# Patient Record
Sex: Male | Born: 1935 | Race: Black or African American | Hispanic: No | Marital: Married | Smoking: Former smoker
Health system: Southern US, Community
[De-identification: ages and names within clinical notes are randomized; demographics above are authoritative.]

## PROBLEM LIST (undated history)

## (undated) DIAGNOSIS — E119 Type 2 diabetes mellitus without complications: Secondary | ICD-10-CM

---

## 2012-05-27 ENCOUNTER — Emergency Department: Payer: Self-pay | Admitting: Emergency Medicine

## 2016-01-28 ENCOUNTER — Emergency Department
Admission: EM | Admit: 2016-01-28 | Discharge: 2016-01-28 | Payer: Medicare Other | Attending: Emergency Medicine | Admitting: Emergency Medicine

## 2016-01-28 ENCOUNTER — Emergency Department: Payer: Medicare Other

## 2016-01-28 ENCOUNTER — Encounter: Payer: Self-pay | Admitting: Emergency Medicine

## 2016-01-28 DIAGNOSIS — Y9389 Activity, other specified: Secondary | ICD-10-CM | POA: Insufficient documentation

## 2016-01-28 DIAGNOSIS — E119 Type 2 diabetes mellitus without complications: Secondary | ICD-10-CM | POA: Insufficient documentation

## 2016-01-28 DIAGNOSIS — S72141A Displaced intertrochanteric fracture of right femur, initial encounter for closed fracture: Secondary | ICD-10-CM | POA: Insufficient documentation

## 2016-01-28 DIAGNOSIS — W1839XA Other fall on same level, initial encounter: Secondary | ICD-10-CM | POA: Insufficient documentation

## 2016-01-28 DIAGNOSIS — Y92810 Car as the place of occurrence of the external cause: Secondary | ICD-10-CM | POA: Insufficient documentation

## 2016-01-28 DIAGNOSIS — S72001A Fracture of unspecified part of neck of right femur, initial encounter for closed fracture: Secondary | ICD-10-CM

## 2016-01-28 DIAGNOSIS — Z87891 Personal history of nicotine dependence: Secondary | ICD-10-CM | POA: Insufficient documentation

## 2016-01-28 DIAGNOSIS — Y990 Civilian activity done for income or pay: Secondary | ICD-10-CM | POA: Insufficient documentation

## 2016-01-28 HISTORY — DX: Type 2 diabetes mellitus without complications: E11.9

## 2016-01-28 LAB — COMPREHENSIVE METABOLIC PANEL
ALBUMIN: 4.2 g/dL (ref 3.5–5.0)
ALK PHOS: 46 U/L (ref 38–126)
ALT: 8 U/L — AB (ref 17–63)
AST: 14 U/L — AB (ref 15–41)
Anion gap: 6 (ref 5–15)
BILIRUBIN TOTAL: 0.9 mg/dL (ref 0.3–1.2)
BUN: 16 mg/dL (ref 6–20)
CO2: 26 mmol/L (ref 22–32)
CREATININE: 1.01 mg/dL (ref 0.61–1.24)
Calcium: 9.9 mg/dL (ref 8.9–10.3)
Chloride: 104 mmol/L (ref 101–111)
GFR calc Af Amer: >60 mL/min (ref >60)
GLUCOSE: 147 mg/dL — AB (ref 65–99)
Potassium: 4.1 mmol/L (ref 3.5–5.1)
Sodium: 136 mmol/L (ref 135–145)
TOTAL PROTEIN: 7.3 g/dL (ref 6.5–8.1)

## 2016-01-28 LAB — CBC WITH DIFFERENTIAL/PLATELET
BASOS ABS: 0 10*3/uL (ref 0–0.1)
Basophils Relative: 0 %
Eosinophils Absolute: 0.2 10*3/uL (ref 0–0.7)
Eosinophils Relative: 2 %
HEMATOCRIT: 37.9 % — AB (ref 40.0–52.0)
HEMOGLOBIN: 12.7 g/dL — AB (ref 13.0–18.0)
LYMPHS PCT: 13 %
Lymphs Abs: 1.2 10*3/uL (ref 1.0–3.6)
MCH: 30.3 pg (ref 26.0–34.0)
MCHC: 33.6 g/dL (ref 32.0–36.0)
MCV: 90.2 fL (ref 80.0–100.0)
MONO ABS: 0.4 10*3/uL (ref 0.2–1.0)
Monocytes Relative: 5 %
NEUTROS ABS: 7.1 10*3/uL — AB (ref 1.4–6.5)
NEUTROS PCT: 80 %
Platelets: 163 10*3/uL (ref 150–440)
RBC: 4.2 MIL/uL — AB (ref 4.40–5.90)
RDW: 14 % (ref 11.5–14.5)
WBC: 8.9 10*3/uL (ref 3.8–10.6)

## 2016-01-28 NOTE — ED Provider Notes (Signed)
[facility] Emergency Department Provider Note  Time seen: 4:00 PM  I have reviewed the triage vital signs and the nursing notes.   HISTORY  Chief Complaint Fall    HPI Jesse Decker is a 82 y.o. male with a past medical history of diabetes who presents the emergency department after mechanical fall. According to The Patient Was outside Trying to Get Something Out Of the Car When [facility] onto His Right Side. Denies Hitting His Head. Denies LOC. Denies Vomiting. His Only Complaint Is Right Hip Pain. Worse with Any Attempted Movement.  Patient describes the right hip pain as moderate, severe when moving. Dull aching type pain, sharp with movement. Normal sensation to the leg.  Past Medical History:  Diagnosis Date  . Diabetes mellitus without complication (HCC)     There are no active problems to display for this patient.   No past surgical history on file.  Prior to Admission medications   Not on File    No Known Allergies  No family history on file.  Social History Social History  Substance Use Topics  . Smoking status: Former Smoker    Types: Cigarettes  . Smokeless tobacco: Not on file  . Alcohol use No    Review of Systems Constitutional: Negative for fever. Cardiovascular: Negative for chest pain. Respiratory: Negative for shortness of breath. Gastrointestinal: Negative for abdominal pain Musculoskeletal: Right hip pain. Skin: Negative for rash. Negative for laceration. Neurological: Negative for headache. Denies numbness. 10-point ROS otherwise negative.  ____________________________________________   PHYSICAL EXAM:  VITAL SIGNS: ED Triage Vitals [01/28/16 1506]  Enc Vitals Group     BP (!) 175/103     Pulse Rate 73     Resp 16     Temp 97.7 F (36.5 C)     Temp Source Oral     SpO2 99 %     Weight      Height      Head Circumference      Peak Flow      Pain Score      Pain Loc      Pain Edu?      Excl. in GC?     Constitutional: Alert and oriented. Well appearing and in no distress. Eyes: Normal exam ENT   Head: Normocephalic and atraumatic   Mouth/Throat: Mucous membranes are moist. Cardiovascular: Normal rate, regular rhythm. No murmur Respiratory: Normal respiratory effort without tachypnea nor retractions. Breath sounds are clear Gastrointestinal: Soft and nontender. No distention. Musculoskeletal: Moderate tenderness palpation of right hip, significant pain with attempted range of motion. Neurovascularly intact with normal sensation to plus DP pulse. Neurologic:  Normal speech and language. No gross focal neurologic deficits Skin:  Skin is warm, dry and intact.  Psychiatric: Mood and affect are normal. Speech and behavior are normal.   ____________________________________________    EKG  EKG reviewed and interpreted by myself shows normal sinus rhythm at 75 bpm, narrow QRS, normal axis, normal intervals, nonspecific ST changes. No ST elevations.  ____________________________________________    RADIOLOGY  Right intertrochanteric fracture.  ____________________________________________   INITIAL IMPRESSION / ASSESSMENT AND PLAN / ED COURSE  Pertinent labs & imaging results that were available during my care of the patient were reviewed by me and considered in my medical decision making (see chart for details).  The patient presents to the emergency department after mechanical fall with right hip pain. The hip is externally rotated with significant tenderness to palpation especially with  range of motion. Suspect right hip fracture, I ordered labs, chest x-ray and EKG. Otherwise the patient's exam is atraumatic. Did not hit head, no LOC or vomiting, no blood thinners.  X-ray consistent with right intertrochanteric fracture. Patient [facility], we'll discuss with the [facility] for possible [facility].   I discussed the patient [facility] who [facility] the patient ER to ER transfer. Patient currently states he is pain-free as long she does not move the hip. ____________________________________________   FINAL CLINICAL IMPRESSION(S) / ED DIAGNOSES  Right hip fracture    Minna Antis, MD 01/28/16 [phone]

## 2016-01-28 NOTE — ED Notes (Signed)
Pt son at bedside, states pt was working outside and neighbor witnessed fall. States more mechanical fall of lost balance.

## 2016-01-28 NOTE — ED Triage Notes (Signed)
Pt to ED via EMS from home for fall. Per EMS neighbors seen pt mowing lawn and on ground aprox . Pt /o RT hip pain, shortening and extrenal rotation noted. Pt poor historian, hx of dementia. Denies any CP

## 2016-01-28 NOTE — ED Notes (Signed)
Report given to [facility]. Pt transferred to EMS stretcher without difficulty or incidence[facility] way.

## 2016-01-28 NOTE — ED Notes (Signed)
[facility]

## 2016-02-18 ENCOUNTER — Emergency Department: Payer: Medicare Other

## 2016-02-18 ENCOUNTER — Emergency Department
Admission: EM | Admit: 2016-02-18 | Discharge: 2016-02-18 | Disposition: A | Discharge status: Home / Self Care | Payer: Medicare Other | Attending: Emergency Medicine | Admitting: Emergency Medicine

## 2016-02-18 DIAGNOSIS — S0990XA Unspecified injury of head, initial encounter: Secondary | ICD-10-CM | POA: Diagnosis present

## 2016-02-18 DIAGNOSIS — Z87891 Personal history of nicotine dependence: Secondary | ICD-10-CM | POA: Diagnosis not present

## 2016-02-18 DIAGNOSIS — W07XXXA Fall from chair, initial encounter: Secondary | ICD-10-CM | POA: Diagnosis not present

## 2016-02-18 DIAGNOSIS — Y999 Unspecified external cause status: Secondary | ICD-10-CM | POA: Diagnosis not present

## 2016-02-18 DIAGNOSIS — S0181XA Laceration without foreign body of other part of head, initial encounter: Secondary | ICD-10-CM

## 2016-02-18 DIAGNOSIS — E119 Type 2 diabetes mellitus without complications: Secondary | ICD-10-CM | POA: Insufficient documentation

## 2016-02-18 DIAGNOSIS — Y9389 Activity, other specified: Secondary | ICD-10-CM | POA: Insufficient documentation

## 2016-02-18 DIAGNOSIS — W19XXXA Unspecified fall, initial encounter: Secondary | ICD-10-CM

## 2016-02-18 DIAGNOSIS — Y929 Unspecified place or not applicable: Secondary | ICD-10-CM | POA: Insufficient documentation

## 2016-02-18 NOTE — ED Triage Notes (Signed)
[facility] since [facility]. Pt has previous right femur fx that was surgically repaired here. Pt has laceration to right side of head. Pt is aaox4. Pt was living by himself before surgery. Pt stating that he leaned over to get something from his chair and fell. Pt denying any pain in right leg at this time. Pt's bandages are in place over surgical wound on RLE.

## 2016-02-18 NOTE — ED Notes (Signed)
Patient transported to CT 

## 2016-02-18 NOTE — ED Notes (Signed)
Pt's son has decided to take [facility]. Pt is okay with this method. Pt is able to stand and transfer. Nurse placed antiskid socks on pt. Pt's laceration to right eye has had dermabond to the site and is closed and no drainage noted.

## 2016-02-18 NOTE — Discharge Instructions (Signed)
Please keep your cut dry for the next 48 hours, then he may wash with warm water and soap gently and pat dry. Return to the emergency department for any bleeding, or any other symptom personally concerning to staff for the patient.

## 2016-02-18 NOTE — ED Notes (Signed)
Pt's son is at bedside. Pt resting quietly.

## 2016-02-18 NOTE — ED Notes (Signed)
Pt able to stand on leg. Pt stating that it does not hurt any worse than it did before. Pt is aaox3.

## 2016-02-18 NOTE — ED Notes (Signed)
Spoke to Frederich Chaebbie Smith, Charity fundraiserN at [facility]. Pt is to be taken home by EMS.

## 2016-02-18 NOTE — ED Provider Notes (Addendum)
[facility] Emergency Department Provider Note  Time seen: 12:03 PM  I have reviewed the triage vital signs and the nursing notes.   HISTORY  Chief Complaint Fall    HPI Jesse Decker is a 82 y.o. male with a past medical history of diabetes, recent right femur surgery who presents to the emergency departmentafter a fall. According to the patient for the patient was leaning forward when he fell forward and hitting the right side of his head. Patient suffered a small laceration approximately 1.5 cm to the right forehead. Patient takes Lovenox daily at the nursing facility. Patient denies any other his comfort. Denies any right leg pain.  Past Medical History:  Diagnosis Date  . Diabetes mellitus without complication (HCC)     There are no active problems to display for this patient.   No past surgical history on file.  Prior to Admission medications   Not on File    No Known Allergies  No family history on file.  Social History Social History  Substance Use Topics  . Smoking status: Former Smoker    Types: Cigarettes  . Smokeless tobacco: Not on file  . Alcohol use No    Review of Systems Constitutional: Negative for fever. Cardiovascular: Negative for chest pain. Respiratory: Negative for shortness of breath. Gastrointestinal: Negative for abdominal pain Genitourinary: Negative for dysuria Neurological: Negative for headache 10-point ROS otherwise negative.  ____________________________________________   PHYSICAL EXAM:  VITAL SIGNS: ED Triage Vitals  Enc Vitals Group     BP 02/18/16 1130 (!) 147/92     Pulse Rate 02/18/16 1130 76     Resp 02/18/16 1130 18     Temp 02/18/16 1130 97.7 F (36.5 C)     Temp Source 02/18/16 1130 Oral     SpO2 02/18/16 1130 99 %     Weight 02/18/16 1131 174 lb 13.2 oz (79.3 kg)     Height 02/18/16 1131 5\' 9"  (1.753 m)     Head Circumference --      Peak Flow --      Pain Score 02/18/16 1132 1      Pain Loc --      Pain Edu? --      Excl. in GC? --     Constitutional: Alert and oriented. Well appearing and in no distress. Eyes: Normal exam ENT   Head: 1.5 cm non-gaping laceration to the right forehead.   Mouth/Throat: Mucous membranes are moist. Cardiovascular: Normal rate, regular rhythm. No murmur Respiratory: Normal respiratory effort without tachypnea nor retractions. Breath sounds are clear  Gastrointestinal: Soft and nontender. No distention.  Musculoskeletal: Nontender with normal range of motion in all extremities. Good range of motion all extremities without tenderness. No C-spine T-spine or L-spine tenderness. Neurologic:  Normal speech and language. No gross focal neurologic deficits  Skin:  Skin is warm, dry and intact.  Psychiatric: Mood and affect are normal.  ____________________________________________     RADIOLOGY  CT head negative   EKG reviewed and interpreted by myself shows normal sinus rhythm at 77 bpm, narrow QRS, normal axis, normal intervals, no concerning ST changes. Reassuring EKG. ____________________________________________   INITIAL IMPRESSION / ASSESSMENT AND PLAN / ED COURSE  Pertinent labs & imaging results that were available during my care of the patient were reviewed by me and considered in my medical decision making (see chart for details).  Patient presents to the emergency department after a fall from a seated position. Patient had a recent  right femur surgery, denies any leg pain. Had good range of motion in all extremities. Patient has a 1.5; laceration to the right forehead which is non-gaping, repaired with Dermabond. Patient does take Lovenox, even his head injury we will obtain a CT scan of the head to rule out intracranial abnormality. His lungs a CT scan is negative we'll discharge the patient home.  CT scan of the head is negative. We'll discharge the patient  home.  ____________________________________________   FINAL CLINICAL IMPRESSION(S) / ED DIAGNOSES  Fall Laceration   Minna AntisKevin Bladyn Tipps, MD 02/18/16 1302    Minna AntisKevin Cherae Marton, MD 02/18/16 [id number]

## 2017-05-22 IMAGING — CR DG CHEST 1V
1 series · 1 of 1 positions shown · non-contrast
Comparison: None.

CLINICAL DATA: Preoperative for hip fracture repair.

EXAM:
CHEST 1 VIEW

[chest ap]
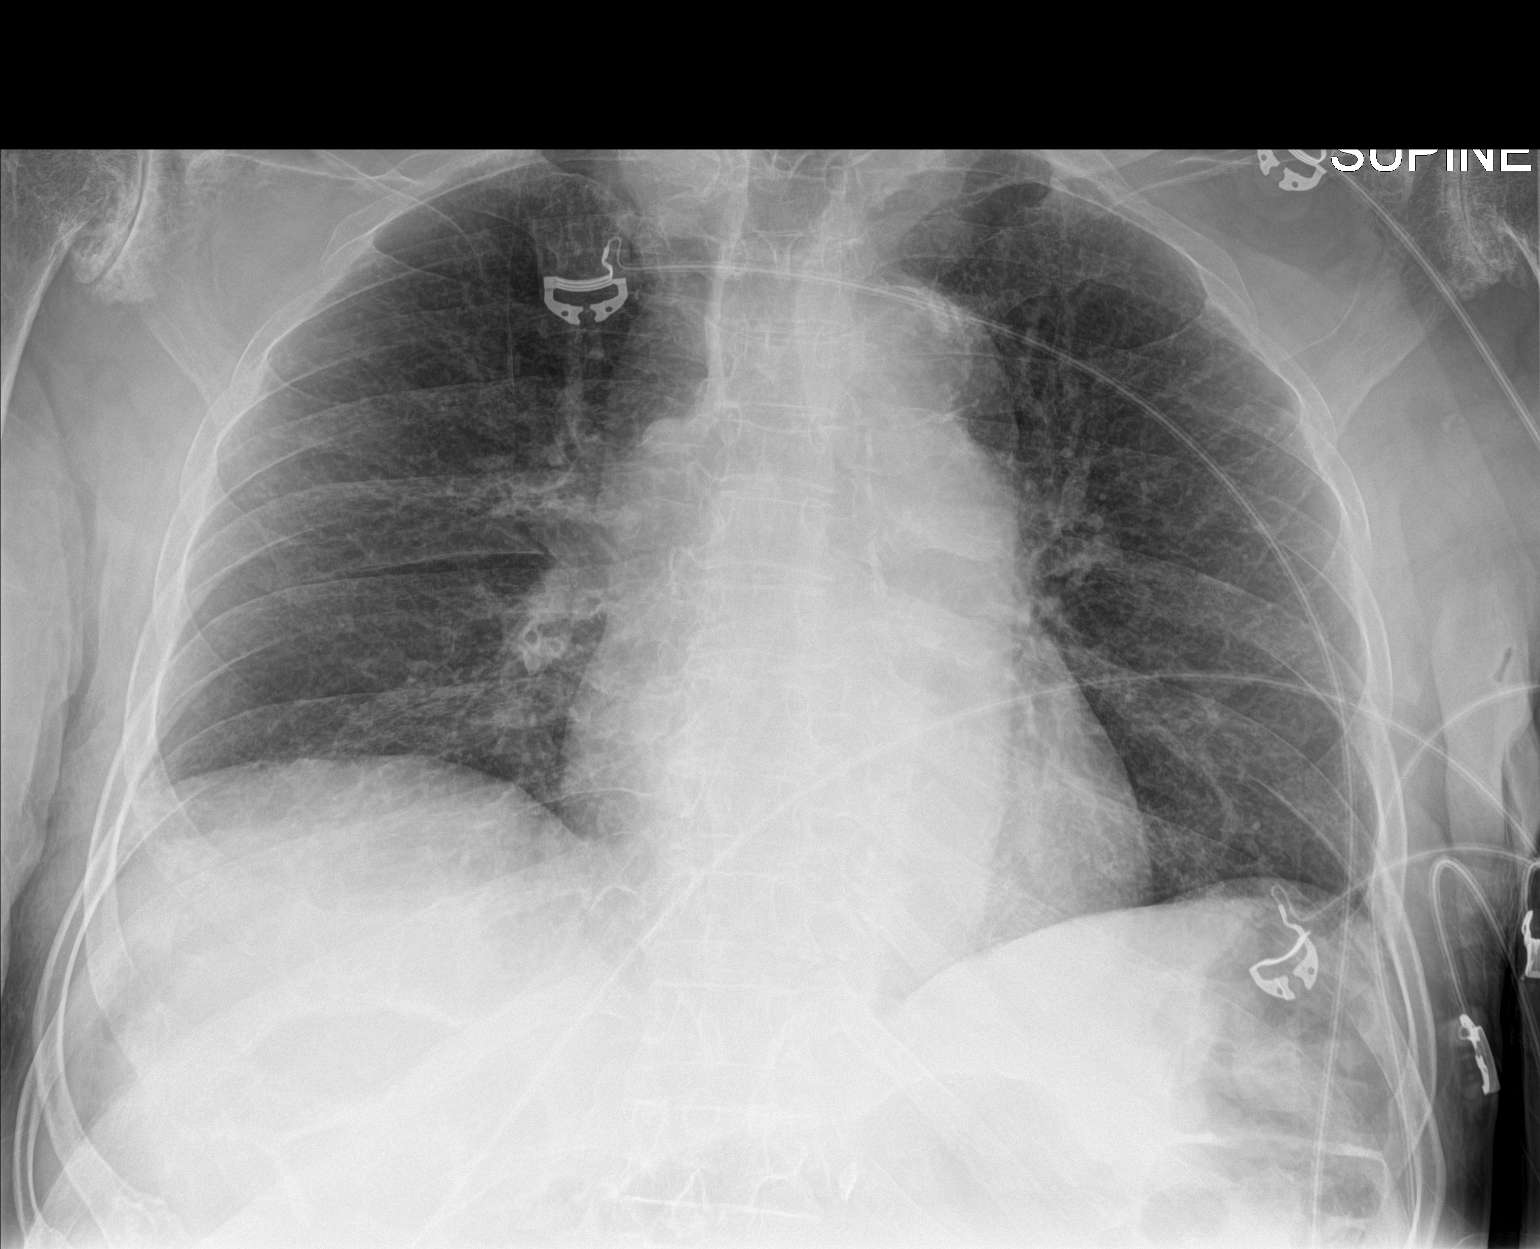

[1 of 1 positions shown; findings below may reference images not displayed]

FINDINGS: Low lung volumes. Normal heart size. Aortic atherosclerosis.
Otherwise normal mediastinal contour. No pneumothorax. No pleural
effusion. No pulmonary edema. No acute consolidative airspace
disease. Prominent osteoarthritis in the glenohumeral joints.
IMPRESSION: No active disease.

## 2021-05-02 DEATH — deceased
